# Patient Record
Sex: Male | Born: 1975 | Race: Black or African American | Hispanic: No | Marital: Married | Smoking: Never smoker
Health system: Southern US, Community
[De-identification: ages and names within clinical notes are randomized; demographics above are authoritative.]

## PROBLEM LIST (undated history)

## (undated) DIAGNOSIS — Z8489 Family history of other specified conditions: Secondary | ICD-10-CM

## (undated) DIAGNOSIS — K219 Gastro-esophageal reflux disease without esophagitis: Secondary | ICD-10-CM

## (undated) DIAGNOSIS — N189 Chronic kidney disease, unspecified: Secondary | ICD-10-CM

## (undated) DIAGNOSIS — E11319 Type 2 diabetes mellitus with unspecified diabetic retinopathy without macular edema: Secondary | ICD-10-CM

## (undated) DIAGNOSIS — E119 Type 2 diabetes mellitus without complications: Secondary | ICD-10-CM

## (undated) DIAGNOSIS — I10 Essential (primary) hypertension: Secondary | ICD-10-CM

## (undated) DIAGNOSIS — R011 Cardiac murmur, unspecified: Secondary | ICD-10-CM

## (undated) HISTORY — PX: EYE SURGERY: SHX253

## (undated) HISTORY — DX: Chronic kidney disease, unspecified: N18.9

---

## 2006-01-22 ENCOUNTER — Other Ambulatory Visit: Payer: Self-pay

## 2006-01-22 ENCOUNTER — Emergency Department: Payer: Self-pay | Admitting: Emergency Medicine

## 2006-03-26 ENCOUNTER — Emergency Department: Payer: Self-pay | Admitting: Emergency Medicine

## 2006-03-26 ENCOUNTER — Other Ambulatory Visit: Payer: Self-pay

## 2006-04-15 ENCOUNTER — Other Ambulatory Visit: Payer: Self-pay

## 2006-04-15 ENCOUNTER — Emergency Department: Payer: Self-pay | Admitting: Emergency Medicine

## 2006-08-16 ENCOUNTER — Emergency Department: Payer: Self-pay | Admitting: Emergency Medicine

## 2006-11-08 ENCOUNTER — Emergency Department: Payer: Self-pay | Admitting: Emergency Medicine

## 2007-03-09 ENCOUNTER — Emergency Department: Payer: Self-pay | Admitting: Emergency Medicine

## 2008-08-09 ENCOUNTER — Emergency Department: Payer: Self-pay | Admitting: Emergency Medicine

## 2012-01-04 ENCOUNTER — Emergency Department: Payer: Self-pay | Admitting: Emergency Medicine

## 2014-07-09 ENCOUNTER — Encounter (INDEPENDENT_AMBULATORY_CARE_PROVIDER_SITE_OTHER): Payer: BLUE CROSS/BLUE SHIELD | Admitting: Ophthalmology

## 2014-07-09 DIAGNOSIS — E10351 Type 1 diabetes mellitus with proliferative diabetic retinopathy with macular edema: Secondary | ICD-10-CM

## 2014-07-09 DIAGNOSIS — I10 Essential (primary) hypertension: Secondary | ICD-10-CM

## 2014-07-09 DIAGNOSIS — H43813 Vitreous degeneration, bilateral: Secondary | ICD-10-CM

## 2014-07-09 DIAGNOSIS — H35033 Hypertensive retinopathy, bilateral: Secondary | ICD-10-CM | POA: Diagnosis not present

## 2014-07-09 DIAGNOSIS — H4313 Vitreous hemorrhage, bilateral: Secondary | ICD-10-CM | POA: Diagnosis not present

## 2014-07-09 DIAGNOSIS — E10311 Type 1 diabetes mellitus with unspecified diabetic retinopathy with macular edema: Secondary | ICD-10-CM

## 2014-07-16 ENCOUNTER — Other Ambulatory Visit (INDEPENDENT_AMBULATORY_CARE_PROVIDER_SITE_OTHER): Payer: BLUE CROSS/BLUE SHIELD | Admitting: Ophthalmology

## 2014-07-22 ENCOUNTER — Other Ambulatory Visit (INDEPENDENT_AMBULATORY_CARE_PROVIDER_SITE_OTHER): Payer: BLUE CROSS/BLUE SHIELD | Admitting: Ophthalmology

## 2014-07-22 DIAGNOSIS — E11311 Type 2 diabetes mellitus with unspecified diabetic retinopathy with macular edema: Secondary | ICD-10-CM | POA: Diagnosis not present

## 2014-07-22 DIAGNOSIS — E11351 Type 2 diabetes mellitus with proliferative diabetic retinopathy with macular edema: Secondary | ICD-10-CM

## 2014-09-22 ENCOUNTER — Encounter (INDEPENDENT_AMBULATORY_CARE_PROVIDER_SITE_OTHER): Payer: BLUE CROSS/BLUE SHIELD | Admitting: Ophthalmology

## 2014-10-04 ENCOUNTER — Encounter (INDEPENDENT_AMBULATORY_CARE_PROVIDER_SITE_OTHER): Payer: BLUE CROSS/BLUE SHIELD | Admitting: Ophthalmology

## 2014-10-04 DIAGNOSIS — H4313 Vitreous hemorrhage, bilateral: Secondary | ICD-10-CM | POA: Diagnosis not present

## 2014-10-04 DIAGNOSIS — H43813 Vitreous degeneration, bilateral: Secondary | ICD-10-CM | POA: Diagnosis not present

## 2014-10-04 DIAGNOSIS — I10 Essential (primary) hypertension: Secondary | ICD-10-CM

## 2014-10-04 DIAGNOSIS — E10311 Type 1 diabetes mellitus with unspecified diabetic retinopathy with macular edema: Secondary | ICD-10-CM

## 2014-10-04 DIAGNOSIS — H35033 Hypertensive retinopathy, bilateral: Secondary | ICD-10-CM | POA: Diagnosis not present

## 2014-10-04 DIAGNOSIS — E10351 Type 1 diabetes mellitus with proliferative diabetic retinopathy with macular edema: Secondary | ICD-10-CM

## 2014-10-04 NOTE — H&P (Signed)
Benjamin Ramos is an 39 y.o. male.   Chief Complaint:floaters and loss of vision left eye HPI: Severe diabetes with poor control, now loss of vision left eye  No past medical history on file.  No past surgical history on file.  No family history on file. Social History:  has no tobacco, alcohol, and drug history on file.  Allergies: Allergies not on file  No prescriptions prior to admission    Review of systems otherwise negative  There were no vitals taken for this visit.  Physical exam: Mental status: oriented x3. Eyes: See eye exam associated with this date of surgery in media tab.  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 Plan: To [facility] for Pars plana vitrectomy, membrane peel, laser, gas injection left eye  Benjamin Ramos 10/04/2014, 5:17 PM

## 2014-10-11 ENCOUNTER — Encounter (HOSPITAL_COMMUNITY): Payer: Self-pay | Admitting: *Deleted

## 2014-10-11 MED ORDER — CEFAZOLIN SODIUM-DEXTROSE 2-3 GM-% IV SOLR
2.0000 g | INTRAVENOUS | Status: DC
  Filled 2014-10-11: qty 50

## 2014-10-11 NOTE — Progress Notes (Signed)
Pt gave consent for spouse to complete SDW -pre-op call while in his presence. Pt denies SOB, chest pain, and being under the care of a cardiologist. Pt denies having an echo and cath but stated that a stress test was completed 5-6 years ago. Pt denies having an EKG and chest x ray within the last year. Pt spouse stated that pt was instructed to report at 8:15 AM on DOS. Spouse also stated that pt was instructed to stop taking Aspirin, NSAID's , vitamins and herbal medications days prior to procedure. Both pt and spouse verbalized understanding of pre-op instructions.

## 2014-10-12 ENCOUNTER — Encounter (HOSPITAL_COMMUNITY): Payer: Self-pay | Admitting: Anesthesiology

## 2014-10-12 ENCOUNTER — Encounter (HOSPITAL_COMMUNITY)
Admission: RE | Disposition: A | Discharge status: Home / Self Care | Payer: Self-pay | Source: Ambulatory Visit | Attending: Ophthalmology

## 2014-10-12 ENCOUNTER — Ambulatory Visit (HOSPITAL_COMMUNITY)
Admission: RE | Admit: 2014-10-12 | Discharge: 2014-10-12 | Disposition: A | Discharge status: Home / Self Care | Payer: BLUE CROSS/BLUE SHIELD | Source: Ambulatory Visit | Attending: Ophthalmology | Admitting: Ophthalmology

## 2014-10-12 ENCOUNTER — Encounter (HOSPITAL_COMMUNITY): Payer: Self-pay | Admitting: *Deleted

## 2014-10-12 ENCOUNTER — Encounter (HOSPITAL_COMMUNITY): Payer: BLUE CROSS/BLUE SHIELD | Admitting: Anesthesiology

## 2014-10-12 DIAGNOSIS — Z79899 Other long term (current) drug therapy: Secondary | ICD-10-CM | POA: Diagnosis not present

## 2014-10-12 DIAGNOSIS — E1165 Type 2 diabetes mellitus with hyperglycemia: Secondary | ICD-10-CM | POA: Insufficient documentation

## 2014-10-12 DIAGNOSIS — H43392 Other vitreous opacities, left eye: Secondary | ICD-10-CM | POA: Insufficient documentation

## 2014-10-12 DIAGNOSIS — H5462 Unqualified visual loss, left eye, normal vision right eye: Secondary | ICD-10-CM | POA: Diagnosis not present

## 2014-10-12 DIAGNOSIS — Z539 Procedure and treatment not carried out, unspecified reason: Secondary | ICD-10-CM | POA: Diagnosis not present

## 2014-10-12 DIAGNOSIS — H3322 Serous retinal detachment, left eye: Secondary | ICD-10-CM | POA: Insufficient documentation

## 2014-10-12 DIAGNOSIS — E11359 Type 2 diabetes mellitus with proliferative diabetic retinopathy without macular edema: Secondary | ICD-10-CM | POA: Diagnosis present

## 2014-10-12 DIAGNOSIS — H4312 Vitreous hemorrhage, left eye: Secondary | ICD-10-CM | POA: Insufficient documentation

## 2014-10-12 HISTORY — DX: Essential (primary) hypertension: I10

## 2014-10-12 HISTORY — DX: Type 2 diabetes mellitus with unspecified diabetic retinopathy without macular edema: E11.319

## 2014-10-12 HISTORY — DX: Family history of other specified conditions: Z84.89

## 2014-10-12 HISTORY — DX: Gastro-esophageal reflux disease without esophagitis: K21.9

## 2014-10-12 HISTORY — DX: Type 2 diabetes mellitus without complications: E11.9

## 2014-10-12 HISTORY — DX: Cardiac murmur, unspecified: R01.1

## 2014-10-12 LAB — CBC
HCT: 40.9 % (ref 39.0–52.0)
Hemoglobin: 13.4 g/dL (ref 13.0–17.0)
MCH: 28.6 pg (ref 26.0–34.0)
MCHC: 32.8 g/dL (ref 30.0–36.0)
MCV: 87.4 fL (ref 78.0–100.0)
Platelets: 246 10*3/uL (ref 150–400)
RBC: 4.68 MIL/uL (ref 4.22–5.81)
RDW: 13.5 % (ref 11.5–15.5)
WBC: 6.4 10*3/uL (ref 4.0–10.5)

## 2014-10-12 LAB — BASIC METABOLIC PANEL
ANION GAP: 9 (ref 5–15)
BUN: 18 mg/dL (ref 6–20)
CO2: 23 mmol/L (ref 22–32)
Calcium: 9.4 mg/dL (ref 8.9–10.3)
Chloride: 105 mmol/L (ref 101–111)
Creatinine, Ser: 1.3 mg/dL — ABNORMAL HIGH (ref 0.61–1.24)
Glucose, Bld: 324 mg/dL — ABNORMAL HIGH (ref 65–99)
Potassium: 4.6 mmol/L (ref 3.5–5.1)
Sodium: 137 mmol/L (ref 135–145)

## 2014-10-12 LAB — GLUCOSE, CAPILLARY: Glucose-Capillary: 313 mg/dL — ABNORMAL HIGH (ref 65–99)

## 2014-10-12 SURGERY — PARS PLANA VITRECTOMY WITH 25 GAUGE
Anesthesia: General | Site: Eye | Laterality: Left

## 2014-10-12 MED ORDER — CYCLOPENTOLATE HCL 1 % OP SOLN: 1.0000 [drp] | OPHTHALMIC | Status: DC | PRN

## 2014-10-12 MED ORDER — GATIFLOXACIN 0.5 % OP SOLN
1.0000 [drp] | OPHTHALMIC | Status: DC | PRN
  Filled 2014-10-12: qty 2.5

## 2014-10-12 MED ORDER — MIDAZOLAM HCL 2 MG/2ML IJ SOLN
INTRAMUSCULAR | Status: AC
  Filled 2014-10-12: qty 2

## 2014-10-12 MED ORDER — TROPICAMIDE 1 % OP SOLN: 1.0000 [drp] | OPHTHALMIC | Status: DC | PRN

## 2014-10-12 MED ORDER — GATIFLOXACIN 0.5 % OP SOLN: 1.0000 [drp] | OPHTHALMIC | Status: DC | PRN

## 2014-10-12 MED ORDER — PHENYLEPHRINE HCL 2.5 % OP SOLN: 1.0000 [drp] | OPHTHALMIC | Status: DC | PRN

## 2014-10-12 MED ORDER — CYCLOPENTOLATE HCL 1 % OP SOLN
1.0000 [drp] | OPHTHALMIC | Status: DC | PRN
  Filled 2014-10-12: qty 2

## 2014-10-12 MED ORDER — TROPICAMIDE 1 % OP SOLN
1.0000 [drp] | OPHTHALMIC | Status: DC | PRN
  Filled 2014-10-12: qty 3

## 2014-10-12 MED ORDER — PHENYLEPHRINE HCL 2.5 % OP SOLN
1.0000 [drp] | OPHTHALMIC | Status: DC | PRN
  Filled 2014-10-12: qty 2

## 2014-10-12 NOTE — H&P (Signed)
I examined the patient today and there is no change in the medical status 

## 2014-10-18 ENCOUNTER — Encounter (INDEPENDENT_AMBULATORY_CARE_PROVIDER_SITE_OTHER): Payer: BLUE CROSS/BLUE SHIELD | Admitting: Ophthalmology

## 2014-10-28 ENCOUNTER — Encounter (INDEPENDENT_AMBULATORY_CARE_PROVIDER_SITE_OTHER): Payer: BLUE CROSS/BLUE SHIELD | Admitting: Ophthalmology

## 2014-10-28 DIAGNOSIS — H4313 Vitreous hemorrhage, bilateral: Secondary | ICD-10-CM

## 2014-10-28 DIAGNOSIS — H35033 Hypertensive retinopathy, bilateral: Secondary | ICD-10-CM | POA: Diagnosis not present

## 2014-10-28 DIAGNOSIS — E11351 Type 2 diabetes mellitus with proliferative diabetic retinopathy with macular edema: Secondary | ICD-10-CM

## 2014-10-28 DIAGNOSIS — E11311 Type 2 diabetes mellitus with unspecified diabetic retinopathy with macular edema: Secondary | ICD-10-CM

## 2014-10-28 DIAGNOSIS — I10 Essential (primary) hypertension: Secondary | ICD-10-CM | POA: Diagnosis not present

## 2014-12-23 ENCOUNTER — Encounter (INDEPENDENT_AMBULATORY_CARE_PROVIDER_SITE_OTHER): Payer: BLUE CROSS/BLUE SHIELD | Admitting: Ophthalmology

## 2015-08-12 ENCOUNTER — Ambulatory Visit (INDEPENDENT_AMBULATORY_CARE_PROVIDER_SITE_OTHER): Payer: BLUE CROSS/BLUE SHIELD | Admitting: Sports Medicine

## 2015-08-12 ENCOUNTER — Encounter: Payer: Self-pay | Admitting: Sports Medicine

## 2015-08-12 DIAGNOSIS — M79672 Pain in left foot: Secondary | ICD-10-CM

## 2015-08-12 DIAGNOSIS — L02612 Cutaneous abscess of left foot: Secondary | ICD-10-CM

## 2015-08-12 DIAGNOSIS — E11621 Type 2 diabetes mellitus with foot ulcer: Secondary | ICD-10-CM | POA: Diagnosis not present

## 2015-08-12 DIAGNOSIS — L97529 Non-pressure chronic ulcer of other part of left foot with unspecified severity: Principal | ICD-10-CM

## 2015-08-12 DIAGNOSIS — L89891 Pressure ulcer of other site, stage 1: Secondary | ICD-10-CM | POA: Diagnosis not present

## 2015-08-12 DIAGNOSIS — L03032 Cellulitis of left toe: Secondary | ICD-10-CM

## 2015-08-12 MED ORDER — AMOXICILLIN-POT CLAVULANATE 875-125 MG PO TABS: 1.0000 | ORAL_TABLET | Freq: Two times a day (BID) | ORAL | Status: DC

## 2015-08-12 NOTE — Progress Notes (Signed)
Patient ID: Benjamin Ramos, male   DOB: 03-04-1975, 40 y.o.   MRN: [id number] Subjective: Benjamin Ramos is a 40 y.o. male patient seen in office for evaluation of ulceration of the Left great toe; states that it started out as a big piece of hard skin that got a crack in it several months ago. Patient has a history of diabetes and a blood glucose level not recorded today but A1c is down from 11.5 to 8.5. Patient is changing the dressing using a natural solution called Methalou. Denies nausea/fever/vomiting/chills/shortness of breath/pain. Admits to night sweats. Patient has no other pedal complaints at this time.  Patient is assisted by wife at this visit.   There are no active problems to display for this patient.  Current Outpatient Prescriptions on File Prior to Visit  Medication Sig Dispense Refill  . aspirin 325 MG tablet Take 325 mg by mouth daily as needed for mild pain.    . metFORMIN (GLUCOPHAGE-XR) 500 MG 24 hr tablet Take 1,000 mg by mouth daily.     . prednisoLONE acetate (PRED FORTE) 1 % ophthalmic suspension Place 1 drop into both eyes every 6 (six) hours.      No current facility-administered medications on file prior to visit.   No Known Allergies  No results found for this or any previous visit (from the past 2160 hour(s)).  Objective: There were no vitals filed for this visit.  General: Patient is awake, alert, oriented x 3 and in no acute distress.  Dermatology: Skin is warm and dry bilateral with a partial thickness ulceration present  Left medial hallux. Ulceration measures 2.5 cm x 2.5 cm x 0.3 cm. There is a  Severely keratotic border with a granular base. The ulceration does not  probe to bone. There is mild malodor, mild active drainage, no erythema, no edema. No other acute signs of infection.   Vascular: Dorsalis Pedis pulse = 2/4 Bilateral,  Posterior Tibial pulse = 1/4 Bilateral,  Capillary Fill Time < 5 seconds, 1+ pitting edema bilateral.   Neurologic:  Protective sensation intact however vibratory sensation diminished bilateral.   Musculosketal: No Pain with palpation to ulcerated area. No pain with compression to calves bilateral. Hallux limitus bilateral.   Assessment and Plan:  Problem List Items Addressed This Visit    None    Visit Diagnoses    Diabetic ulcer of left foot associated with type 2 diabetes mellitus (HCC)    -  Primary    Relevant Medications    glimepiride (AMARYL) 2 MG tablet    losartan-hydrochlorothiazide (HYZAAR) 100-25 MG tablet    amoxicillin-clavulanate (AUGMENTIN) 875-125 MG tablet    Other Relevant Orders    WOUND CULTURE    Cellulitis and abscess of toe, left        Relevant Medications    amoxicillin-clavulanate (AUGMENTIN) 875-125 MG tablet    Other Relevant Orders    WOUND CULTURE    Foot pain, left        Relevant Medications    amoxicillin-clavulanate (AUGMENTIN) 875-125 MG tablet    Other Relevant Orders    WOUND CULTURE      -Examined patient and discussed the progression of the wound and treatment alternatives. - Excisionally dedbrided ulceration to healthy bleeding borders using a sterile tissue nipper. -Applied iodosorb, offloading pad, and dry sterile dressing and instructed patient to continue with daily dressings at home consisting of betadine and bandaid/dry sterile dressing. Refrain from natural remedies unless approved by me -Wound culture  obtained and sent to [facility] -Rx Augmentin -Dispensed post op shoe and instructed patient to wear at all times when ambulating until ulceration is healed; Work note given to allow shoe or to modify duty to allow patient to be able to wear this shoe at work - Advised patient to go to the ER or return to office if the wound worsens or if constitutional symptoms are present. -Patient to return to office in 2 weeks for follow up care and evaluation or sooner if problems arise. Will xray at next encounter if fails to improve.  Asencion Islam, DPM

## 2015-08-15 ENCOUNTER — Telehealth: Payer: Self-pay | Admitting: Sports Medicine

## 2015-08-15 NOTE — Telephone Encounter (Signed)
Pt wants to know if it is ok to get his foot week since dr. Marylene Landstover removed a callus

## 2015-08-15 NOTE — Telephone Encounter (Signed)
Informed pt if there was no bleeding to the area where the callous was trimmed he could get it wet.

## 2015-08-16 ENCOUNTER — Telehealth: Payer: Self-pay | Admitting: Sports Medicine

## 2015-08-16 DIAGNOSIS — L089 Local infection of the skin and subcutaneous tissue, unspecified: Secondary | ICD-10-CM

## 2015-08-16 MED ORDER — CIPROFLOXACIN HCL 500 MG PO TABS: 500.0000 mg | ORAL_TABLET | Freq: Two times a day (BID) | ORAL | Status: DC

## 2015-08-16 NOTE — Telephone Encounter (Signed)
Called patient to review wound culture results. Positive for MSSA and Beta hemolytic strep. Sent to patient pharmacy Cipro to take in addition to Augmentin until completed. Patient to follow up as scheduled or sooner if issues arise -Dr. Marylene LandStover

## 2015-08-22 ENCOUNTER — Encounter: Payer: Self-pay | Admitting: Sports Medicine

## 2015-08-26 ENCOUNTER — Ambulatory Visit (INDEPENDENT_AMBULATORY_CARE_PROVIDER_SITE_OTHER): Payer: BLUE CROSS/BLUE SHIELD | Admitting: Sports Medicine

## 2015-08-26 ENCOUNTER — Encounter: Payer: Self-pay | Admitting: Sports Medicine

## 2015-08-26 DIAGNOSIS — E119 Type 2 diabetes mellitus without complications: Secondary | ICD-10-CM | POA: Diagnosis not present

## 2015-08-26 DIAGNOSIS — L84 Corns and callosities: Secondary | ICD-10-CM | POA: Diagnosis not present

## 2015-08-26 NOTE — Progress Notes (Signed)
Patient ID: Benjamin Ramos, male   DOB: 04-14-1975, 40 y.o.   MRN: [id number]   Subjective: Benjamin Ramos is a 40 y.o. male patient seen in office for follow up evaluation of ulceration of the Left great toe; states that it is much better and has been dressing area with betadine with assistance of wife. Patient is also taking antibiotics with no problems. Denies nausea/fever/vomiting/chills/night sweats/shortness of breath/pain. Patient has no other pedal complaints at this time.  Patient is assisted by wife and daughter at this visit.   There are no active problems to display for this patient.  Current Outpatient Prescriptions on File Prior to Visit  Medication Sig Dispense Refill  . amoxicillin-clavulanate (AUGMENTIN) 875-125 MG tablet Take 1 tablet by mouth 2 (two) times daily. 20 tablet 0  . aspirin 325 MG tablet Take 325 mg by mouth daily as needed for mild pain.    . ciprofloxacin (CIPRO) 500 MG tablet Take 1 tablet (500 mg total) by mouth 2 (two) times daily. 20 tablet 0  . glimepiride (AMARYL) 2 MG tablet     . losartan-hydrochlorothiazide (HYZAAR) 100-25 MG tablet     . metFORMIN (GLUCOPHAGE-XR) 500 MG 24 hr tablet Take 1,000 mg by mouth daily.     . prednisoLONE acetate (PRED FORTE) 1 % ophthalmic suspension Place 1 drop into both eyes every 6 (six) hours.      No current facility-administered medications on file prior to visit.   No Known Allergies  No results found for this or any previous visit (from the past 2160 hour(s)).  Objective: There were no vitals filed for this visit.  General: Patient is awake, alert, oriented x 3 and in no acute distress.  Dermatology: Skin is warm and dry bilateral with a now healed previous ulceration at Left medial hallux, no erythema, no edema. No other acute signs of infection.   Vascular: Dorsalis Pedis pulse = 2/4 Bilateral,  Posterior Tibial pulse = 1/4 Bilateral,  Capillary Fill Time < 5 seconds, 1+ pitting edema bilateral.    Neurologic: Protective sensation intact however vibratory sensation diminished bilateral.   Musculosketal: No Pain with palpation to pre-ulcerative area. No pain with compression to calves bilateral. Hallux limitus bilateral.   Assessment and Plan:  Problem List Items Addressed This Visit    None    Visit Diagnoses    Pre-ulcerative calluses    -  Primary    Left medial hallux at area of previous ulceration     Diabetes mellitus without complication (HCC)          -Examined patient and discussed continue plan of care -Dispensed silicone toe pad to left and advised patient that he may slowly return to normal shoes; in future we will make orthotics to prevent recurrence since patient has hallux limitus which is likely contributing to his condition -Cont with Augmentin and Cipro until completed for + wound culture of S. Aureus and Beta Hemplytic Strept - Advised patient to go to the ER or return to office if the wound recurs or if area worsens or if constitutional symptoms are present. -Patient to return to office in 4 weeks for follow up care and evaluation or sooner if problems arise. Will also set up patient for routine diabetic nail care if area remains healed at next visit.   Asencion Islamitorya Yunior Jain, DPM

## 2015-09-23 ENCOUNTER — Ambulatory Visit: Payer: BLUE CROSS/BLUE SHIELD | Admitting: Sports Medicine

## 2015-10-07 ENCOUNTER — Ambulatory Visit (INDEPENDENT_AMBULATORY_CARE_PROVIDER_SITE_OTHER): Payer: BLUE CROSS/BLUE SHIELD | Admitting: Sports Medicine

## 2015-10-07 ENCOUNTER — Encounter: Payer: Self-pay | Admitting: Sports Medicine

## 2015-10-07 DIAGNOSIS — L89891 Pressure ulcer of other site, stage 1: Secondary | ICD-10-CM

## 2015-10-07 DIAGNOSIS — L97529 Non-pressure chronic ulcer of other part of left foot with unspecified severity: Principal | ICD-10-CM

## 2015-10-07 DIAGNOSIS — L02612 Cutaneous abscess of left foot: Secondary | ICD-10-CM

## 2015-10-07 DIAGNOSIS — E11621 Type 2 diabetes mellitus with foot ulcer: Secondary | ICD-10-CM

## 2015-10-07 DIAGNOSIS — L03032 Cellulitis of left toe: Secondary | ICD-10-CM

## 2015-10-07 DIAGNOSIS — M79672 Pain in left foot: Secondary | ICD-10-CM

## 2015-10-07 DIAGNOSIS — L089 Local infection of the skin and subcutaneous tissue, unspecified: Secondary | ICD-10-CM

## 2015-10-07 MED ORDER — CIPROFLOXACIN HCL 500 MG PO TABS: 500.0000 mg | ORAL_TABLET | Freq: Two times a day (BID) | ORAL | 0 refills | Status: DC

## 2015-10-07 MED ORDER — AMOXICILLIN-POT CLAVULANATE 875-125 MG PO TABS: 1.0000 | ORAL_TABLET | Freq: Two times a day (BID) | ORAL | 0 refills | Status: DC

## 2015-10-07 NOTE — Progress Notes (Signed)
Patient ID: Benjamin Ramos Phegley, male   DOB: 02-10-1976, 40 y.o.   MRN: [id number]   Subjective: Benjamin Ramos Brickle is a 40 y.o. Diabetic male patient seen in office for follow up evaluation of ulceration of the Left great toe; states that missed last appointment and last week started to drain; thinks from wearing ill fitting shoes. Applied betadine. Denies nausea/fever/vomiting/chills/night sweats/shortness of breath/pain. Patient has no other pedal complaints at this time.  FBS 127  There are no active problems to display for this patient.  Current Outpatient Prescriptions on File Prior to Visit  Medication Sig Dispense Refill  . aspirin 325 MG tablet Take 325 mg by mouth daily as needed for mild pain.    Marland Kitchen. glimepiride (AMARYL) 2 MG tablet     . losartan-hydrochlorothiazide (HYZAAR) 100-25 MG tablet     . metFORMIN (GLUCOPHAGE-XR) 500 MG 24 hr tablet Take 1,000 mg by mouth daily.     . prednisoLONE acetate (PRED FORTE) 1 % ophthalmic suspension Place 1 drop into both eyes every 6 (six) hours.      No current facility-administered medications on file prior to visit.    No Known Allergies  No results found for this or any previous visit (from the past 2160 hour(s)).  Objective: There were no vitals filed for this visit.  General: Patient is awake, alert, oriented x 3 and in no acute distress.  Dermatology: Skin is warm and dry bilateral with a partial thickness ulceration at Left medial hallux measuring 3x3cm, no erythema, no edema, mild odor. No other acute signs of infection.   Vascular: Dorsalis Pedis pulse = 2/4 Bilateral,  Posterior Tibial pulse = 1/4 Bilateral,  Capillary Fill Time < 5 seconds, trace edema bilateral.   Neurologic: Protective sensation intact however vibratory sensation diminished bilateral.   Musculosketal: No Pain with palpation to ulcerative area. No pain with compression to calves bilateral. Hallux limitus bilateral.   Assessment and Plan:  Problem List Items  Addressed This Visit    None    Visit Diagnoses    Diabetic ulcer of left foot associated with type 2 diabetes mellitus (HCC)       Relevant Medications   amoxicillin-clavulanate (AUGMENTIN) 875-125 MG tablet   Cellulitis and abscess of toe, left       Relevant Medications   amoxicillin-clavulanate (AUGMENTIN) 875-125 MG tablet   Foot pain, left       Relevant Medications   amoxicillin-clavulanate (AUGMENTIN) 875-125 MG tablet   Foot infection       Relevant Medications   ciprofloxacin (CIPRO) 500 MG tablet     -Examined patient and discussed continue plan of care for recurrent ulceration -Excisionally debrided ulceration to healthy bleeding and applied iodosorb with offloading pad and dry dressing. Cont with daily dressings at home consisting of betadine -Rx Augmentin and Cipro for past + culture of S. Aureus and Beta Hemolytic strep -Return to using post op shoe In future we will make orthotics to prevent recurrence since patient has hallux limitus which is likely contributing to his condition - Advised patient to go to the ER or return to office if the wound or if area worsens or if constitutional symptoms are present. -Patient to return to office in 2 weeks for follow up care and evaluation or sooner if problems arise.  Asencion Islamitorya Naijah Lacek, DPM

## 2015-10-21 ENCOUNTER — Ambulatory Visit: Payer: BLUE CROSS/BLUE SHIELD | Admitting: Sports Medicine

## 2015-10-25 ENCOUNTER — Encounter: Payer: Self-pay | Admitting: Sports Medicine

## 2015-10-25 ENCOUNTER — Ambulatory Visit (INDEPENDENT_AMBULATORY_CARE_PROVIDER_SITE_OTHER): Payer: BLUE CROSS/BLUE SHIELD | Admitting: Sports Medicine

## 2015-10-25 DIAGNOSIS — M79672 Pain in left foot: Secondary | ICD-10-CM | POA: Diagnosis not present

## 2015-10-25 DIAGNOSIS — E11621 Type 2 diabetes mellitus with foot ulcer: Secondary | ICD-10-CM | POA: Diagnosis not present

## 2015-10-25 DIAGNOSIS — L97529 Non-pressure chronic ulcer of other part of left foot with unspecified severity: Secondary | ICD-10-CM

## 2015-10-25 NOTE — Progress Notes (Signed)
Patient ID: Lynett FishRoy F Spranger, male   DOB: 08-24-1975, 40 y.o.   MRN: [id number]   Subjective: Lynett FishRoy F Innes is a 40 y.o. Diabetic male patient seen in office for follow up evaluation of ulceration of the Left great toe; states that it feels better and has been dry with no drainage, has been applying betadine. Denies nausea/fever/vomiting/chills/night sweats/shortness of breath/pain. Patient has no other pedal complaints at this time.  Tolerating antibiotics well with no issues.  FBS not recorded meter broken  There are no active problems to display for this patient.  Current Outpatient Prescriptions on File Prior to Visit  Medication Sig Dispense Refill  . amoxicillin-clavulanate (AUGMENTIN) 875-125 MG tablet Take 1 tablet by mouth 2 (two) times daily. 20 tablet 0  . aspirin 325 MG tablet Take 325 mg by mouth daily as needed for mild pain.    . ciprofloxacin (CIPRO) 500 MG tablet Take 1 tablet (500 mg total) by mouth 2 (two) times daily. 20 tablet 0  . glimepiride (AMARYL) 2 MG tablet     . losartan-hydrochlorothiazide (HYZAAR) 100-25 MG tablet     . metFORMIN (GLUCOPHAGE-XR) 500 MG 24 hr tablet Take 1,000 mg by mouth daily.     . prednisoLONE acetate (PRED FORTE) 1 % ophthalmic suspension Place 1 drop into both eyes every 6 (six) hours.      No current facility-administered medications on file prior to visit.    No Known Allergies  No results found for this or any previous visit (from the past 2160 hour(s)).  Objective: There were no vitals filed for this visit.  General: Patient is awake, alert, oriented x 3 and in no acute distress.  Dermatology: Skin is warm and dry bilateral with a now healed ulceration at Left medial hallux, no erythema, no edema, no malodor. No other acute signs of infection.   Vascular: Dorsalis Pedis pulse = 2/4 Bilateral,  Posterior Tibial pulse = 1/4 Bilateral,  Capillary Fill Time < 5 seconds, trace edema bilateral.   Neurologic: Protective sensation intact  however vibratory sensation diminished bilateral.   Musculosketal: No Pain with palpation to pre-ulcerative area. No pain with compression to calves bilateral. Hallux limitus bilateral.   Assessment and Plan:  Problem List Items Addressed This Visit    None    Visit Diagnoses    Diabetic ulcer of left foot associated with type 2 diabetes mellitus (HCC)    -  Primary   Prematurely healed   Foot pain, left         -Examined patient and discussed continue plan of care for healed ulceration -Debrided left hallux pre-ulcerative lesion with no underlying opening. Cont with offloading pads and post op shoe x 1 week -Continue Augmentin and Cipro until completed for past + culture of S. Aureus and Beta Hemolytic strep -[employer] diabetic shoe order form was completed; office to contact primary care for approval / certification;  Office to arrange shoe fitting and dispensing. -Patient to return to office for shoe measurement  or sooner if problems arise.  Asencion Islamitorya Jackelyn Illingworth, DPM

## 2015-11-08 ENCOUNTER — Telehealth: Payer: Self-pay | Admitting: *Deleted

## 2015-11-08 NOTE — Telephone Encounter (Signed)
11/07/15 10:25 am  Received transferred voicemail from Benjamin Ramos stating she has left multiple messages in regards to her husbands diabetic shoes and has not gotten a return call.  [place] Patient.  11/08/15 10:08 am  Called and spoke with patient he states that they called his insurance [address] [employer] and they told his wife that they would pay for the shoes and inserts if there is a written prescription on file.  I explained to the patient the diabetic shoes are a medicare program, I do know his insurance will cover a portion of the orthotics but was my understanding that the shoes were not covered.  I explained the Charges $190 for the shoes and $354 for the inserts and that he would be responsible for the balance of any denied charges.  He stated understanding.  I verified the doctor on file and he stated that he no longer see that doctor.  New [facility] [place].  I will submit our paperwork for authorization and contact him if/when we receive it. Patient stated understanding

## 2015-11-15 ENCOUNTER — Encounter: Payer: Self-pay | Admitting: Podiatry

## 2015-11-15 ENCOUNTER — Ambulatory Visit (INDEPENDENT_AMBULATORY_CARE_PROVIDER_SITE_OTHER): Payer: BLUE CROSS/BLUE SHIELD | Admitting: Podiatry

## 2015-11-15 VITALS — BP 186/112 | HR 81 | Resp 16

## 2015-11-15 DIAGNOSIS — M79676 Pain in unspecified toe(s): Secondary | ICD-10-CM | POA: Diagnosis not present

## 2015-11-15 DIAGNOSIS — E11621 Type 2 diabetes mellitus with foot ulcer: Secondary | ICD-10-CM | POA: Diagnosis not present

## 2015-11-15 DIAGNOSIS — L84 Corns and callosities: Secondary | ICD-10-CM | POA: Diagnosis not present

## 2015-11-15 DIAGNOSIS — L97529 Non-pressure chronic ulcer of other part of left foot with unspecified severity: Secondary | ICD-10-CM

## 2015-11-15 DIAGNOSIS — B351 Tinea unguium: Secondary | ICD-10-CM | POA: Diagnosis not present

## 2015-11-15 DIAGNOSIS — M79672 Pain in left foot: Secondary | ICD-10-CM

## 2015-11-15 NOTE — Patient Instructions (Signed)
Diabetes and Foot Care Diabetes may cause you to have problems because of poor blood supply (circulation) to your feet and legs. This may cause the skin on your feet to become thinner, break easier, and heal more slowly. Your skin may become dry, and the skin may peel and crack. You may also have nerve damage in your legs and feet causing decreased feeling in them. You may not notice minor injuries to your feet that could lead to infections or more serious problems. Taking care of your feet is one of the most important things you can do for yourself.  HOME CARE INSTRUCTIONS  Wear shoes at all times, even in the house. Do not go barefoot. Bare feet are easily injured.  Check your feet daily for blisters, cuts, and redness. If you cannot see the bottom of your feet, use a mirror or ask someone for help.  Wash your feet with warm water (do not use hot water) and mild soap. Then pat your feet and the areas between your toes until they are completely dry. Do not soak your feet as this can dry your skin.  Apply a moisturizing lotion or petroleum jelly (that does not contain alcohol and is unscented) to the skin on your feet and to dry, brittle toenails. Do not apply lotion between your toes.  Trim your toenails straight across. Do not dig under them or around the cuticle. File the edges of your nails with an emery board or nail file.  Do not cut corns or calluses or try to remove them with medicine.  Wear clean socks or stockings every day. Make sure they are not too tight. Do not wear knee-high stockings since they may decrease blood flow to your legs.  Wear shoes that fit properly and have enough cushioning. To break in new shoes, wear them for just a few hours a day. This prevents you from injuring your feet. Always look in your shoes before you put them on to be sure there are no objects inside.  Do not cross your legs. This may decrease the blood flow to your feet.  If you find a minor scrape,  cut, or break in the skin on your feet, keep it and the skin around it clean and dry. These areas may be cleansed with mild soap and water. Do not cleanse the area with peroxide, alcohol, or iodine.  When you remove an adhesive bandage, be sure not to damage the skin around it.  If you have a wound, look at it several times a day to make sure it is healing.  Do not use heating pads or hot water bottles. They may burn your skin. If you have lost feeling in your feet or legs, you may not know it is happening until it is too late.  Make sure your health care provider performs a complete foot exam at least annually or more often if you have foot problems. Report any cuts, sores, or bruises to your health care provider immediately. SEEK MEDICAL CARE IF:   You have an injury that is not healing.  You have cuts or breaks in the skin.  You have an ingrown nail.  You notice redness on your legs or feet.  You feel burning or tingling in your legs or feet.  You have pain or cramps in your legs and feet.  Your legs or feet are numb.  Your feet always feel cold. SEEK IMMEDIATE MEDICAL CARE IF:   There is increasing redness,   swelling, or pain in or around a wound.  There is a red line that goes up your leg.  Pus is coming from a wound.  You develop a fever or as directed by your health care provider.  You notice a bad smell coming from an ulcer or wound.   This information is not intended to replace advice given to you by your health care provider. Make sure you discuss any questions you have with your health care provider.   Document Released: 02/10/2000 Document Revised: 10/15/2012 Document Reviewed: 07/22/2012 Elsevier Interactive Patient Education 2016 [employer]  

## 2015-11-15 NOTE — Progress Notes (Signed)
SUBJECTIVE Patient with a history of diabetes mellitus presents to office today complaining of elongated, thickened nails. Pain while ambulating in shoes. Patient is unable to trim their own nails.  Patient also has a callus lesion noted to the left hallux plantar leak. This lesion needs to be an ulcer which seems to have callused over. Patient also has a question about diabetic shoes which he states he ordered at one point pending approval through his primary care physician.  No Known Allergies  OBJECTIVE General Patient is awake, alert, and oriented x 3 and in no acute distress. Derm Skin is dry and supple bilateral. Negative open lesions or macerations. Remaining integument unremarkable. Nails are tender, long, thickened and dystrophic with subungual debris, consistent with onychomycosis, 1-5 bilateral. No signs of infection noted. Vasc  DP and PT pedal pulses palpable bilaterally. Temperature gradient within normal limits.  Neuro Epicritic and protective threshold sensation diminished bilaterally.  Musculoskeletal Exam No symptomatic pedal deformities noted bilateral. Muscular strength within normal limits.  ASSESSMENT 1. Diabetes Mellitus w/ peripheral neuropathy 2. Onychomycosis of nail due to dermatophyte bilateral 3. Pain in foot bilateral 4. Healed ulceration noted to the left hallux. 5. Callus left hallux  PLAN OF CARE Patient evaluated today. Instructed to maintain good pedal hygiene and foot care. Stressed importance of controlling blood sugar.  Mechanical debridement of nails 1-5 bilaterally performed using a nail nipper. Filed with dremel without incident.  Mechanical debridement of the callus lesion to the left hallux was performed using a chisel blade. All patient questions were answered. Return to clinic in 3 mos.   Also working on the approval paperwork to get the patient diabetic shoe gear with Plastizote inserts. Follow up with Melody.   Felecia ShellingBrent M Ramos, DPM

## 2016-05-28 ENCOUNTER — Ambulatory Visit (INDEPENDENT_AMBULATORY_CARE_PROVIDER_SITE_OTHER): Payer: BLUE CROSS/BLUE SHIELD | Admitting: Podiatry

## 2016-05-28 ENCOUNTER — Encounter: Payer: Self-pay | Admitting: Podiatry

## 2016-05-28 VITALS — BP 175/101 | HR 84 | Temp 98.2°F

## 2016-05-28 DIAGNOSIS — E11621 Type 2 diabetes mellitus with foot ulcer: Secondary | ICD-10-CM

## 2016-05-28 DIAGNOSIS — L97521 Non-pressure chronic ulcer of other part of left foot limited to breakdown of skin: Secondary | ICD-10-CM | POA: Diagnosis not present

## 2016-05-28 DIAGNOSIS — L84 Corns and callosities: Secondary | ICD-10-CM

## 2016-05-28 MED ORDER — DOXYCYCLINE HYCLATE 100 MG PO TABS: 100.0000 mg | ORAL_TABLET | Freq: Two times a day (BID) | ORAL | 0 refills | Status: AC

## 2016-05-28 NOTE — Progress Notes (Signed)
This patient presents the office with chief complaint of a blister or an abscess on the bottom of the left big toe. He says that last Friday. He noticed there was a blister and bleeding noted from the toe as the blister has enlarged since last Friday and he presents the office today for an evaluation. Patient is a diabetic who previously had been treated for an ulcer on the left hallux. He presents the office today for continued evaluation of this blister.    GENERAL APPEARANCE: Alert, conversant. Appropriately groomed. No acute distress.  VASCULAR: Pedal pulses are  palpable at  [facility] and PT bilateral.  Capillary refill time is immediate to all digits,  Normal temperature gradient.  Digital hair growth is present bilateral  NEUROLOGIC: sensation is normal to 5.07 monofilament at 5/5 sites bilateral.  Light touch is intact bilateral, Muscle strength normal.  MUSCULOSKELETAL: acceptable muscle strength, tone and stability bilateral.  Intrinsic muscluature intact bilateral.  Rectus appearance of foot and digits noted bilateral.   DERMATOLOGIC: skin color, texture, and turgor are within normal limits.  No preulcerative lesions or ulcers  are seen, no interdigital maceration noted.  No open lesions present.  Digital nails are asymptomatic. No drainage noted. Black callus tissue noted under the medial aspect of the lateral left hallux  above a fluctuant blister on the left hallux.  No evidence of any redness, swelling or evidence of pus or drainage.  Blister left hallyux  Pre-ulcerous lesion left hallux.  ROV  incision and drainage of the blister. 2 longitudinal incisions were made on the medial and lateral aspect of the blister. Care was taken to keep the skin above the blister intact. The drainage was clear fluid with no evidence of any pus coming from the toe. The blister then was bandaged with Neosporin and a dry sterile dressing. Home instructions for soaks were given. He was prescribed a prescription of  doxycycline 100 mg 1 twice a day return to the office in one week for further evaluation and treatment.  If this condition worsens or becomes very painful, the patient was told to contact this office or go to the Emergency Department at the hospital.  Helane Gunther DPM

## 2016-06-01 ENCOUNTER — Ambulatory Visit: Payer: BLUE CROSS/BLUE SHIELD | Admitting: Podiatry

## 2016-06-07 ENCOUNTER — Ambulatory Visit: Payer: BLUE CROSS/BLUE SHIELD | Admitting: Podiatry

## 2016-07-05 ENCOUNTER — Encounter: Payer: BLUE CROSS/BLUE SHIELD | Attending: Internal Medicine | Admitting: *Deleted

## 2016-07-05 ENCOUNTER — Encounter: Payer: Self-pay | Admitting: *Deleted

## 2016-07-05 VITALS — BP 134/94 | Ht 71.0 in | Wt 242.2 lb

## 2016-07-05 DIAGNOSIS — Z713 Dietary counseling and surveillance: Secondary | ICD-10-CM | POA: Diagnosis present

## 2016-07-05 DIAGNOSIS — N184 Chronic kidney disease, stage 4 (severe): Secondary | ICD-10-CM

## 2016-07-05 DIAGNOSIS — E119 Type 2 diabetes mellitus without complications: Secondary | ICD-10-CM | POA: Diagnosis not present

## 2016-07-05 DIAGNOSIS — E1122 Type 2 diabetes mellitus with diabetic chronic kidney disease: Secondary | ICD-10-CM

## 2016-07-05 NOTE — Patient Instructions (Signed)
Check blood sugars 2 x day before breakfast and 2 hrs after supper every day  Bring blood sugar records to the next class  Exercise: Walk   for    10-15  minutes  3 days a week and gradually increase to 30 minutes 5 x week  Eat 3 meals day, 1-2  snacks a day Space meals 4-6 hours apart Don't skip meals Limit fried foods Avoid sugar sweetened drinks (soda)  Return for classes on:

## 2016-07-05 NOTE — Progress Notes (Signed)
Diabetes Self-Management Education  Visit Type: First/Initial  Appt. Start Time: 1530 Appt. End Time: 1645  07/05/2016  [name], identified by name and date of birth, is a 41 y.o. male with a diagnosis of Diabetes: Type 2.   ASSESSMENT  Blood pressure (!) 134/94, height 5\' 11"  (1.803 m), weight 242 lb 3.2 oz (109.9 kg). Body mass index is 33.78 kg/m.      Diabetes Self-Management Education - 07/05/16 1752      Visit Information   Visit Type First/Initial     Initial Visit   Diabetes Type Type 2   Are you currently following a meal plan? Yes   What type of meal plan do you follow? "grilled food, less fried food"   Are you taking your medications as prescribed? Yes   Date Diagnosed 16 years ago     Psychosocial Assessment   Patient Belief/Attitude about Diabetes Defeat/Burnout  "it has taken a hard hit to my manhood"   Self-care barriers None   Self-management support Doctor's office;Family   Other persons present Spouse/SO   Patient Concerns Nutrition/Meal planning;Medication;Monitoring;Healthy Lifestyle;Problem Solving;Weight Control;Glycemic Control   Special Needs None   Preferred Learning Style Auditory;Hands on   Learning Readiness Ready   How often do you need to have someone help you when you read instructions, pamphlets, or other written materials from your doctor or pharmacy? 2 - Rarely  wife completed paperwork   What is the last grade level you completed in school? 10th     Pre-Education Assessment   Patient understands the diabetes disease and treatment process. Needs Instruction   Patient understands incorporating nutritional management into lifestyle. Needs Instruction   Patient undertands incorporating physical activity into lifestyle. Needs Instruction   Patient understands using medications safely. Needs Instruction   Patient understands monitoring blood glucose, interpreting and using results Needs Review   Patient understands prevention,  detection, and treatment of acute complications. Needs Instruction   Patient understands prevention, detection, and treatment of chronic complications. Needs Instruction   Patient understands how to develop strategies to address psychosocial issues. Needs Instruction   Patient understands how to develop strategies to promote health/change behavior. Needs Instruction     Complications   Last HgB A1C per patient/outside source 11.2 %  05/16/16   How often do you check your blood sugar? 0 times/day (not testing)  pt tested once in last 2 weeks. He reports reading of <120 an hour pp.    Number of hypoglycemic episodes per month --  He reports having symptoms after he skipped a meal and took his medication. He didn't check his blood sugar.    Have you had a dilated eye exam in the past 12 months? Yes   Have you had a dental exam in the past 12 months? No   Are you checking your feet? Yes   How many days per week are you checking your feet? 7     Dietary Intake   Breakfast skips   Lunch subway sub - chicken, bacon, ranch or [place] - 6 inch with no chips   Snack (afternoon) peanut butter crackers   Dinner fried meat (chicken, pork chop, fish) with green beans, occasional potatoes, beans, rice, peppers, onions   Beverage(s) water, occasional diet soda, ginger-ale     Exercise   Exercise Type ADL's     Patient Education   Previous Diabetes Education Yes (please comment)  2008 - can't remember if he came to classes or not  Disease state  Explored patient's options for treatment of their diabetes   Nutrition management  Role of diet in the treatment of diabetes and the relationship between the three main macronutrients and blood glucose level;Reviewed blood glucose goals for pre and post meals and how to evaluate the patients' food intake on their blood glucose level.;Meal timing in regards to the patients' current diabetes medication.   Physical activity and exercise  Role of exercise on  diabetes management, blood pressure control and cardiac health.   Medications Reviewed patients medication for diabetes, action, purpose, timing of dose and side effects.   Monitoring Purpose and frequency of SMBG.;Taught/discussed recording of test results and interpretation of SMBG.;Interpreting lab values - A1C, lipid, urine microalbumina.;Identified appropriate SMBG and/or A1C goals.   Acute complications Taught treatment of hypoglycemia - the 15 rule.   Chronic complications Relationship between chronic complications and blood glucose control;Nephropathy, what it is, prevention of, the use of ACE, ARB's and early detection of through urine microalbumia.   Psychosocial adjustment Identified and addressed patients feelings and concerns about diabetes     Individualized Goals (developed by patient)   Reducing Risk Improve blood sugars Decrease medications Prevent diabetes complications Lose weight Lead a healthier lifestyle Become more fit     Outcomes   Expected Outcomes Demonstrated interest in learning. Expect positive outcomes   Future DMSE 2 wks      Individualized Plan for Diabetes Self-Management Training:   Learning Objective:  Patient will have a greater understanding of diabetes self-management. Patient education plan is to attend individual and/or group sessions per assessed needs and concerns.   Plan:   Patient Instructions  Check blood sugars 2 x day before breakfast and 2 hrs after supper every day Bring blood sugar records to the next class Exercise: Walk   for    10-15  minutes  3 days a week and gradually increase to 30 minutes 5 x week Eat 3 meals day, 1-2  snacks a day Space meals 4-6 hours apart Don't skip meals Limit fried foods Avoid sugar sweetened drinks (soda)  Expected Outcomes:  Demonstrated interest in learning. Expect positive outcomes  Education material provided:  General Meal Planning Guidelines Simple Meal Plan Symptoms, causes and  treatments of Hypoglycemia  If problems or questions, patient to contact team via:  Sharion Settler, RN, CCM, CDE [phone]  Future DSME appointment: 2 wks  Jul 16, 2016 for Diabetes Class 1

## 2016-07-16 ENCOUNTER — Ambulatory Visit: Payer: BLUE CROSS/BLUE SHIELD

## 2016-07-18 ENCOUNTER — Telehealth: Payer: Self-pay | Admitting: Dietician

## 2016-07-18 NOTE — Telephone Encounter (Signed)
Called patient and left voicemail message to reschedule class 1 which he missed on 07/16/16. Offered upcoming dates and times and requested a call back.

## 2016-07-30 ENCOUNTER — Ambulatory Visit: Payer: BLUE CROSS/BLUE SHIELD

## 2016-08-06 ENCOUNTER — Ambulatory Visit: Payer: BLUE CROSS/BLUE SHIELD

## 2016-08-24 ENCOUNTER — Encounter: Payer: Self-pay | Admitting: *Deleted

## 2017-07-26 ENCOUNTER — Ambulatory Visit
Admission: RE | Admit: 2017-07-26 | Discharge: 2017-07-26 | Disposition: A | Discharge status: Home / Self Care | Payer: BLUE CROSS/BLUE SHIELD | Source: Ambulatory Visit | Attending: Medical Oncology | Admitting: Medical Oncology

## 2017-07-26 ENCOUNTER — Other Ambulatory Visit: Payer: Self-pay | Admitting: Medical Oncology

## 2017-07-26 DIAGNOSIS — J181 Lobar pneumonia, unspecified organism: Secondary | ICD-10-CM

## 2020-03-23 IMAGING — CR DG CHEST 2V
2 series · 2 of 2 positions shown · non-contrast
Comparison: None.

CLINICAL DATA: Follow-up pneumonia.

EXAM:
CHEST - 2 VIEW

[chest pa]
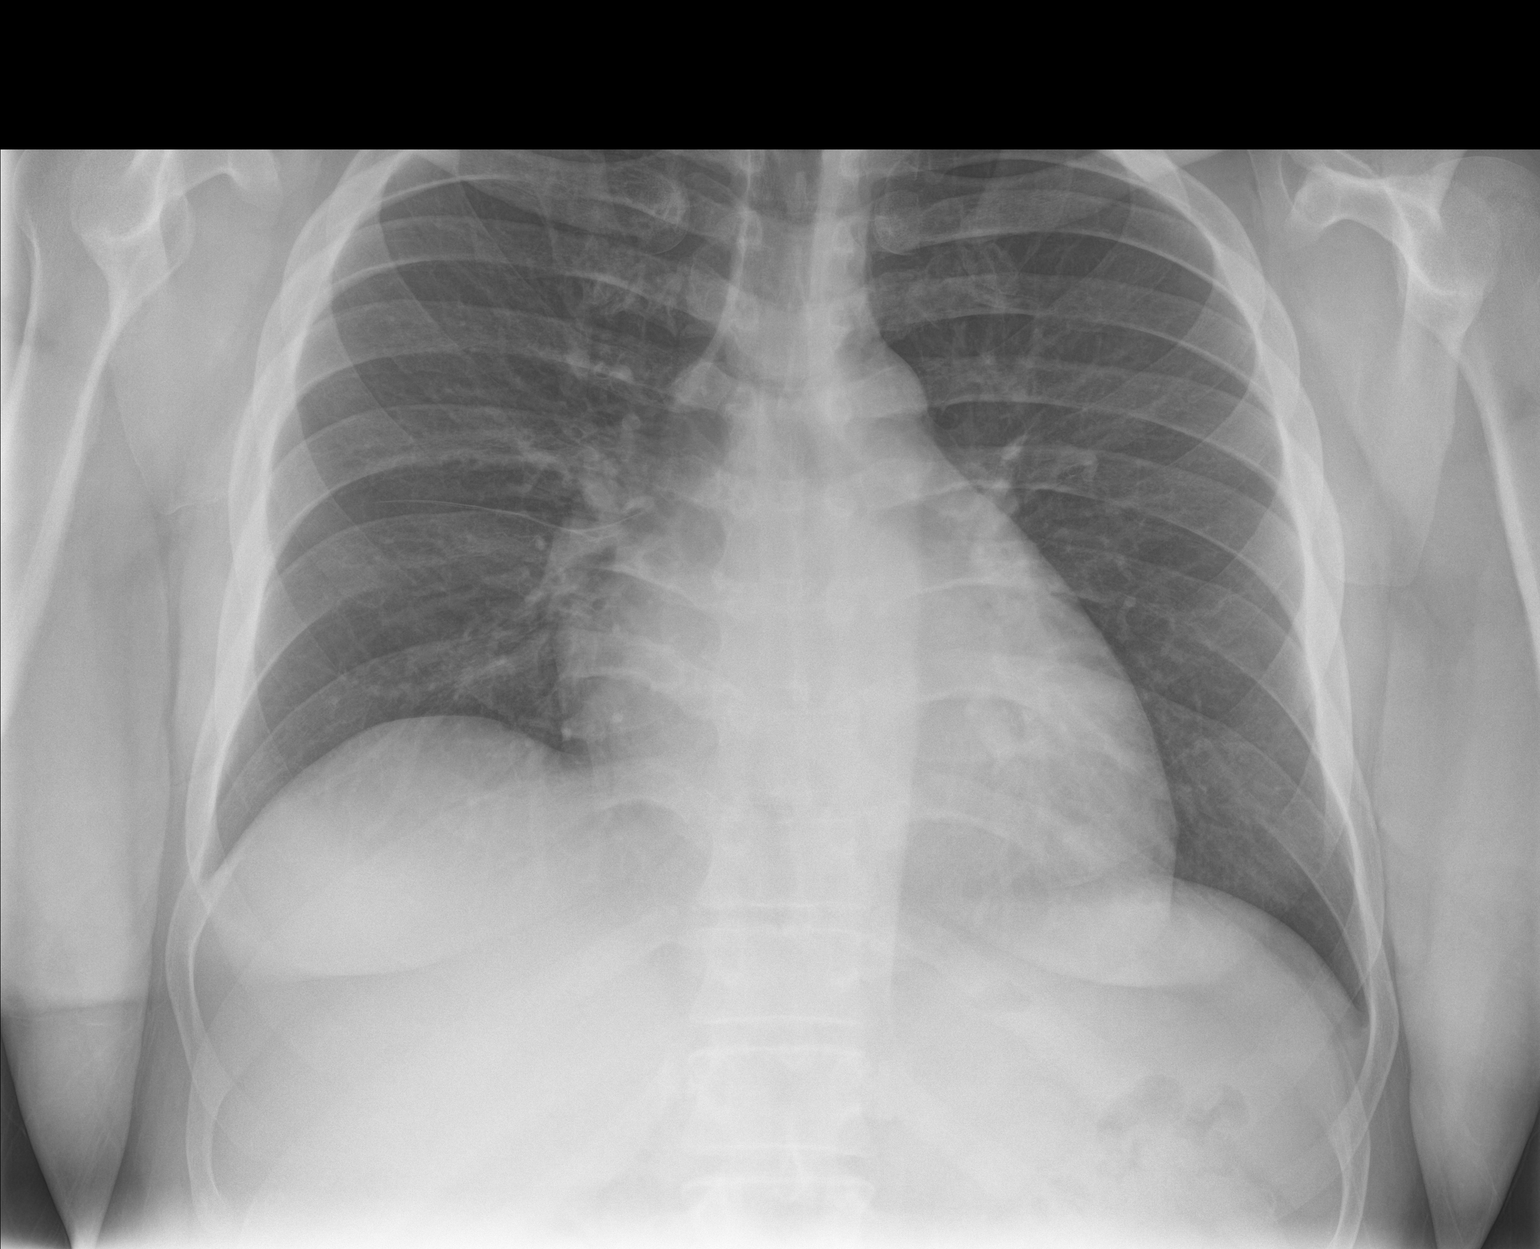

[chest lat]
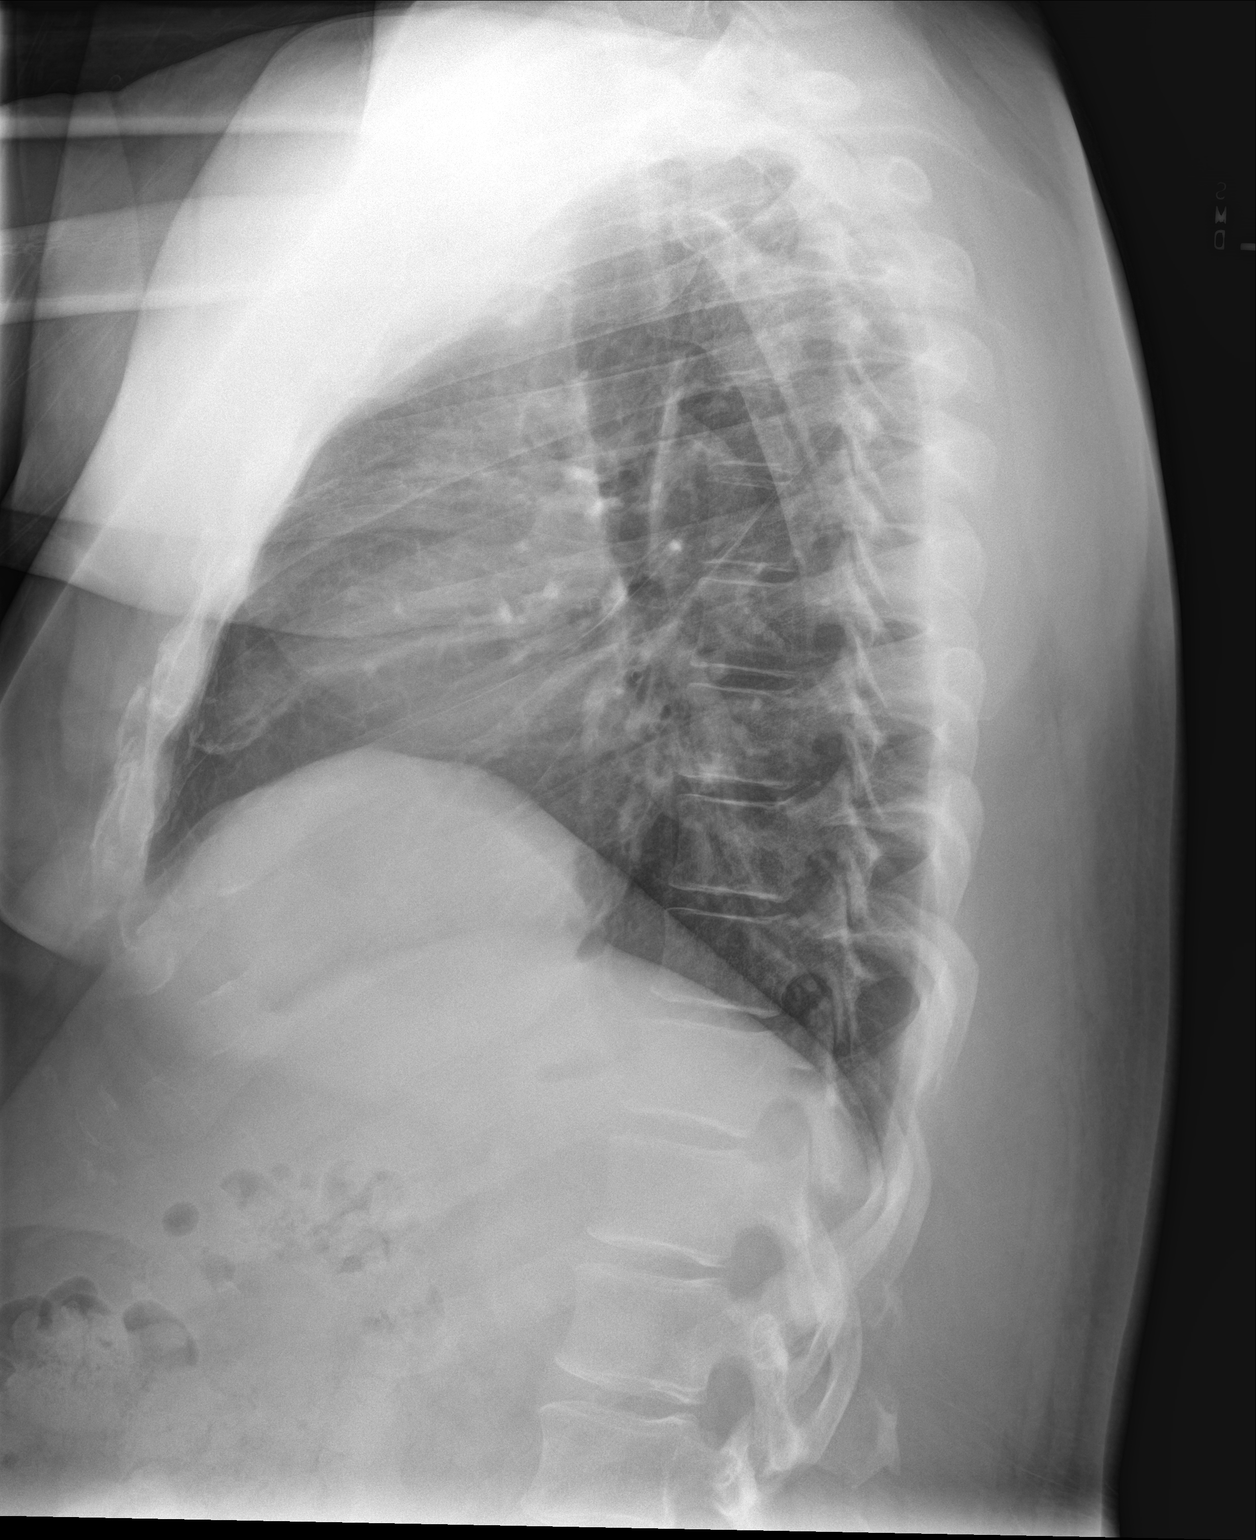

[2 of 2 positions shown; findings below may reference images not displayed]

FINDINGS: The heart size and mediastinal contours are within normal limits.
Both lungs are clear. The visualized skeletal structures are
unremarkable.
IMPRESSION: No active cardiopulmonary disease.

## 2022-06-21 ENCOUNTER — Emergency Department
Admission: EM | Admit: 2022-06-21 | Discharge: 2022-06-21 | Disposition: A | Discharge status: Home / Self Care | Payer: Medicare Other | Attending: Emergency Medicine | Admitting: Emergency Medicine

## 2022-06-21 ENCOUNTER — Encounter: Payer: Self-pay | Admitting: Emergency Medicine

## 2022-06-21 DIAGNOSIS — I120 Hypertensive chronic kidney disease with stage 5 chronic kidney disease or end stage renal disease: Secondary | ICD-10-CM | POA: Insufficient documentation

## 2022-06-21 DIAGNOSIS — E1122 Type 2 diabetes mellitus with diabetic chronic kidney disease: Secondary | ICD-10-CM | POA: Insufficient documentation

## 2022-06-21 DIAGNOSIS — R040 Epistaxis: Secondary | ICD-10-CM | POA: Insufficient documentation

## 2022-06-21 DIAGNOSIS — N186 End stage renal disease: Secondary | ICD-10-CM | POA: Insufficient documentation

## 2022-06-21 DIAGNOSIS — Z992 Dependence on renal dialysis: Secondary | ICD-10-CM | POA: Diagnosis not present

## 2022-06-21 LAB — CBC WITH DIFFERENTIAL/PLATELET
Abs Immature Granulocytes: 0.03 10*3/uL (ref 0.00–0.07)
Basophils Absolute: 0 10*3/uL (ref 0.0–0.1)
Basophils Relative: 0 %
Eosinophils Absolute: 0.3 10*3/uL (ref 0.0–0.5)
Eosinophils Relative: 4 %
HCT: 35.5 % — ABNORMAL LOW (ref 39.0–52.0)
Hemoglobin: 10.7 g/dL — ABNORMAL LOW (ref 13.0–17.0)
Immature Granulocytes: 0 %
Lymphocytes Relative: 24 %
Lymphs Abs: 1.7 10*3/uL (ref 0.7–4.0)
MCH: 27.3 pg (ref 26.0–34.0)
MCHC: 30.1 g/dL (ref 30.0–36.0)
MCV: 90.6 fL (ref 80.0–100.0)
Monocytes Absolute: 0.7 10*3/uL (ref 0.1–1.0)
Monocytes Relative: 10 %
Neutro Abs: 4.2 10*3/uL (ref 1.7–7.7)
Neutrophils Relative %: 62 %
Platelets: 137 10*3/uL — ABNORMAL LOW (ref 150–400)
RBC: 3.92 MIL/uL — ABNORMAL LOW (ref 4.22–5.81)
RDW: 22.1 % — ABNORMAL HIGH (ref 11.5–15.5)
Smear Review: NORMAL
WBC: 6.9 10*3/uL (ref 4.0–10.5)
nRBC: 0 % (ref 0.0–0.2)

## 2022-06-21 LAB — COMPREHENSIVE METABOLIC PANEL
ALT: 27 U/L (ref 0–44)
AST: 28 U/L (ref 15–41)
Albumin: 3.5 g/dL (ref 3.5–5.0)
Alkaline Phosphatase: 227 U/L — ABNORMAL HIGH (ref 38–126)
Anion gap: 15 (ref 5–15)
BUN: 66 mg/dL — ABNORMAL HIGH (ref 6–20)
CO2: 24 mmol/L (ref 22–32)
Calcium: 7.1 mg/dL — ABNORMAL LOW (ref 8.9–10.3)
Chloride: 99 mmol/L (ref 98–111)
Creatinine, Ser: 9.57 mg/dL — ABNORMAL HIGH (ref 0.61–1.24)
GFR, Estimated: 6 mL/min — ABNORMAL LOW (ref >60)
Glucose, Bld: 131 mg/dL — ABNORMAL HIGH (ref 70–99)
Potassium: 4.7 mmol/L (ref 3.5–5.1)
Sodium: 138 mmol/L (ref 135–145)
Total Bilirubin: 1.3 mg/dL — ABNORMAL HIGH (ref 0.3–1.2)
Total Protein: 7.7 g/dL (ref 6.5–8.1)

## 2022-06-21 MED ORDER — AMOXICILLIN-POT CLAVULANATE 875-125 MG PO TABS: 1.0000 | ORAL_TABLET | Freq: Two times a day (BID) | ORAL | 0 refills | Status: AC

## 2022-06-21 MED ORDER — AMLODIPINE BESYLATE 5 MG PO TABS
10.0000 mg | ORAL_TABLET | Freq: Once | ORAL | Status: AC
  Administered 2022-06-21: 10 mg via ORAL
  Filled 2022-06-21: qty 2

## 2022-06-21 MED ORDER — AMOXICILLIN-POT CLAVULANATE 875-125 MG PO TABS
1.0000 | ORAL_TABLET | Freq: Once | ORAL | Status: AC
  Administered 2022-06-21: 1 via ORAL

## 2022-06-21 MED ORDER — TRANEXAMIC ACID FOR EPISTAXIS
500.0000 mg | Freq: Once | TOPICAL | Status: AC
  Administered 2022-06-21: 500 mg via TOPICAL
  Filled 2022-06-21 (×2): qty 10

## 2022-06-21 MED ORDER — OXYMETAZOLINE HCL 0.05 % NA SOLN
1.0000 | Freq: Once | NASAL | Status: AC
  Administered 2022-06-21: 1 via NASAL
  Filled 2022-06-21: qty 30

## 2022-06-21 MED ORDER — ACETAMINOPHEN 325 MG PO TABS
650.0000 mg | ORAL_TABLET | Freq: Once | ORAL | Status: AC
  Administered 2022-06-21: 650 mg via ORAL

## 2022-06-21 MED ORDER — LABETALOL HCL 200 MG PO TABS
400.0000 mg | ORAL_TABLET | Freq: Once | ORAL | Status: AC
  Administered 2022-06-21: 400 mg via ORAL
  Filled 2022-06-21: qty 2

## 2022-06-21 NOTE — ED Notes (Signed)
Patient verbalizes understanding of discharge instructions. Opportunity for questioning and answers were provided. Armband removed by staff, pt discharged from ED. Wheeled out to and assisted into car with wife

## 2022-06-21 NOTE — ED Provider Notes (Signed)
[facility] Provider Note  Patient Contact: 10:18 PM (approximate)   History   Epistaxis   HPI  Benjamin Ramos is a 47 y.o. male with a history of hypertension, diabetes and awaiting kidney transplant currently on dialysis, presents to the emergency department with epistaxis that has occurred since this morning.  Patient reports that bleeding originally occurred from the left nare but patient is having bleeding from both nares as of now.  Patient denies experiencing similar symptoms in the past.  No chest pain, chest tightness or abdominal pain.      Physical Exam   Triage Vital Signs: ED Triage Vitals  Enc Vitals Group     BP 06/21/22 2137 (!) 150/91     Pulse Rate 06/21/22 2137 65     Resp 06/21/22 2137 18     Temp 06/21/22 2137 98.7 F (37.1 C)     Temp src --      SpO2 06/21/22 2137 96 %     Weight 06/21/22 2135 238 lb 8.6 oz (108.2 kg)     Height 06/21/22 2135  (1.803 m)     Head Circumference --      Peak Flow --      Pain Score 06/21/22 2135 0     Pain Loc --      Pain Edu? --      Excl. in GC? --     Most recent vital signs: Vitals:   06/21/22 2236 06/21/22 2245  BP: (!) 161/80 (!) 161/77  Pulse: 62 63  Resp: 18 19  Temp:    SpO2: 96% 95%     General: Alert and in no acute distress. Eyes:  PERRL. EOMI. Head: No acute traumatic findings ENT:      Nose: Patient has trickle bleeding from the bilateral nares a      Mouth/Throat: Mucous membranes are moist. Neck: No stridor. No cervical spine tenderness to palpation. Cardiovascular:  Good peripheral perfusion Respiratory: Normal respiratory effort without tachypnea or retractions. Lungs CTAB. Good air entry to the bases with no decreased or absent breath sounds. Gastrointestinal: Bowel sounds 4 quadrants. Soft and nontender to palpation. No guarding or rigidity. No palpable masses. No distention. No CVA tenderness. Musculoskeletal: Full range of motion to all extremities.   Neurologic:  No gross focal neurologic deficits are appreciated.  Skin:   No rash noted    ED Results / Procedures / Treatments   Labs (all labs ordered are listed, but only abnormal results are displayed) Labs Reviewed  CBC WITH DIFFERENTIAL/PLATELET - Abnormal; Notable for the following components:      Result Value   RBC 3.92 (*)    Hemoglobin 10.7 (*)    HCT 35.5 (*)    RDW 22.1 (*)    Platelets 137 (*)    All other components within normal limits  COMPREHENSIVE METABOLIC PANEL - Abnormal; Notable for the following components:   Glucose, Bld 131 (*)    BUN 66 (*)    Creatinine, Ser 9.57 (*)    Calcium 7.1 (*)    Alkaline Phosphatase 227 (*)    Total Bilirubin 1.3 (*)    GFR, Estimated 6 (*)    All other components within normal limits        PROCEDURES:  Critical Care performed: No  Procedures   MEDICATIONS ORDERED IN ED: Medications  oxymetazoline (AFRIN) 0.05 % nasal spray 1 spray (1 spray Each Nare Given 06/21/22 2233)  labetalol (NORMODYNE) tablet  400 mg (400 mg Oral Given 06/21/22 2335)  amLODipine (NORVASC) tablet 10 mg (10 mg Oral Given 06/21/22 2231)  tranexamic acid (CYKLOKAPRON) 1000 MG/10ML topical solution 500 mg (500 mg Topical Given by Other 06/21/22 2337)  amoxicillin-clavulanate (AUGMENTIN) 875-125 MG per tablet 1 tablet (1 tablet Oral Given 06/21/22 2354)  acetaminophen (TYLENOL) tablet 650 mg (650 mg Oral Given 06/21/22 2354)     IMPRESSION / MDM / ASSESSMENT AND PLAN / ED COURSE  I reviewed the triage vital signs and the nursing notes.                              Assessment and plan: Epistaxis:  47 year old male with history of hypertension, diabetes, end-stage renal disease currently on kidney transplant list, presents to the emergency department with epistaxis of the bilateral nares that started this morning.  Patient was hypertensive at triage but vital signs otherwise reassuring.  On exam, patient had a slow trickle of blood from the  bilateral nares.  He was resting comfortably and tolerating his own secretions.  Basic labs were obtained which showed reassuring H&H.  Patient has not missed any dialysis appointments and aside from epistaxis, his overall felt well.  CMP is consistent with his baseline labs.  Patient still had persistent trickle of bleeding from the bilateral nares despite nasal clipping and Afrin use.  I did reach out to otolaryngologist on-call, Dr. Andee Poles and discussed patient's case.  Dr. Andee Poles recommended unilateral and possibly bilateral packing with Merocel.  He suggested packing 1 side and observing.  Patient's left nare was packed with Merisel and patient only had a very scant amount of bleeding from the right nare and was observed for approximately 30 to 45 minutes after Murocel placement.  I discussed packing the left side and admitting patient overnight for observation but patient declined at this time, stating that he would like to hold off on packing the right nare.  I do think that this is appropriate at this time given very scant amount of active bleeding.  Patient and wife do not live very far from the emergency department and they state that they can return to the emergency department if bleeding of the left nare were to increase tonight.  Dr. Andee Poles did recommend being discharged with antibiotic.  Patient was started on his first dose of Augmentin while in the emergency department and was given Tylenol for discomfort.  He was also given his nighttime blood pressure medicines, labetalol and amlodipine.  I emphasized the importance of relaxing at home with keeping the head of the bed elevated and compliance to blood pressure medicine.  Patient voiced understanding.  He was advised to make an appointment with Dr. Andee Poles for Merocel removal.  All patient questions were answered prior to discharge.     FINAL CLINICAL IMPRESSION(S) / ED DIAGNOSES   Final diagnoses:  Epistaxis     Rx / DC Orders    ED Discharge Orders          Ordered    amoxicillin-clavulanate (AUGMENTIN) 875-125 MG tablet  2 times daily        06/21/22 2343             Note:  This document was prepared using Dragon voice recognition software and may include unintentional dictation errors.   Pia [address], PA-C 06/21/22 Phineas Douglas    Corena Herter, MD 06/26/22 [phone]

## 2022-06-21 NOTE — Discharge Instructions (Addendum)
Please make follow up appointment with Dr. Andee Poles for Murocel removal.  Start taking Augmentin twice daily.  Return if symptoms worsen at home.

## 2022-06-21 NOTE — ED Triage Notes (Signed)
Pt presents ambulatory to triage via POV with complaints of nose bleed that started this AM. Not on thinners, denies any injury or scratch. He notes not seeing any clotting but has some dripping of blood from his R nare. He notes during the day the bleeding has gone from L to R nare. A&Ox4 at this time. Denies dizziness, CP or SOB.

## 2022-06-22 ENCOUNTER — Other Ambulatory Visit: Payer: Self-pay

## 2022-06-22 ENCOUNTER — Emergency Department
Admission: EM | Admit: 2022-06-22 | Discharge: 2022-06-22 | Disposition: A | Discharge status: Home / Self Care | Payer: Medicare Other | Attending: Student in an Organized Health Care Education/Training Program | Admitting: Student in an Organized Health Care Education/Training Program

## 2022-06-22 DIAGNOSIS — R040 Epistaxis: Secondary | ICD-10-CM | POA: Diagnosis not present

## 2022-06-22 DIAGNOSIS — N186 End stage renal disease: Secondary | ICD-10-CM | POA: Insufficient documentation

## 2022-06-22 DIAGNOSIS — J3489 Other specified disorders of nose and nasal sinuses: Secondary | ICD-10-CM | POA: Insufficient documentation

## 2022-06-22 DIAGNOSIS — Z992 Dependence on renal dialysis: Secondary | ICD-10-CM | POA: Insufficient documentation

## 2022-06-22 LAB — BASIC METABOLIC PANEL
Anion gap: 14 (ref 5–15)
BUN: 77 mg/dL — ABNORMAL HIGH (ref 6–20)
CO2: 25 mmol/L (ref 22–32)
Calcium: 7.4 mg/dL — ABNORMAL LOW (ref 8.9–10.3)
Chloride: 98 mmol/L (ref 98–111)
Creatinine, Ser: 10.39 mg/dL — ABNORMAL HIGH (ref 0.61–1.24)
GFR, Estimated: 6 mL/min — ABNORMAL LOW (ref >60)
Glucose, Bld: 131 mg/dL — ABNORMAL HIGH (ref 70–99)
Potassium: 5.3 mmol/L — ABNORMAL HIGH (ref 3.5–5.1)
Sodium: 137 mmol/L (ref 135–145)

## 2022-06-22 LAB — CBC WITH DIFFERENTIAL/PLATELET
Abs Immature Granulocytes: 0.02 10*3/uL (ref 0.00–0.07)
Basophils Absolute: 0 10*3/uL (ref 0.0–0.1)
Basophils Relative: 1 %
Eosinophils Absolute: 0.2 10*3/uL (ref 0.0–0.5)
Eosinophils Relative: 4 %
HCT: 35.5 % — ABNORMAL LOW (ref 39.0–52.0)
Hemoglobin: 10.8 g/dL — ABNORMAL LOW (ref 13.0–17.0)
Immature Granulocytes: 0 %
Lymphocytes Relative: 22 %
Lymphs Abs: 1.4 10*3/uL (ref 0.7–4.0)
MCH: 27.6 pg (ref 26.0–34.0)
MCHC: 30.4 g/dL (ref 30.0–36.0)
MCV: 90.8 fL (ref 80.0–100.0)
Monocytes Absolute: 0.7 10*3/uL (ref 0.1–1.0)
Monocytes Relative: 10 %
Neutro Abs: 4.2 10*3/uL (ref 1.7–7.7)
Neutrophils Relative %: 63 %
Platelets: 127 10*3/uL — ABNORMAL LOW (ref 150–400)
RBC: 3.91 MIL/uL — ABNORMAL LOW (ref 4.22–5.81)
RDW: 21.8 % — ABNORMAL HIGH (ref 11.5–15.5)
WBC: 6.6 10*3/uL (ref 4.0–10.5)
nRBC: 0 % (ref 0.0–0.2)

## 2022-06-22 MED ORDER — LIDOCAINE HCL (PF) 1 % IJ SOLN
INTRAMUSCULAR | Status: AC
  Filled 2022-06-22: qty 5

## 2022-06-22 MED ORDER — LIDOCAINE-EPINEPHRINE (PF) 2 %-1:200000 IJ SOLN
INTRAMUSCULAR | Status: AC
  Filled 2022-06-22: qty 20

## 2022-06-22 MED ORDER — LIDOCAINE-EPINEPHRINE 2 %-1:100000 IJ SOLN
20.0000 mL | Freq: Once | INTRAMUSCULAR | Status: AC
  Administered 2022-06-22: 20 mL

## 2022-06-22 MED ORDER — OXYMETAZOLINE HCL 0.05 % NA SOLN
1.0000 | Freq: Once | NASAL | Status: AC
  Administered 2022-06-22: 1 via NASAL
  Filled 2022-06-22: qty 30

## 2022-06-22 NOTE — ED Triage Notes (Signed)
Pt comes with c/o nose bleed. Pt was seen here in ED last night and had left side packed. Pt states it continues to bleed. Pt is not on thinners.   Attempted to place nose plug but too small. No bleeding noted at this time.

## 2022-06-22 NOTE — ED Provider Notes (Signed)
[facility] Provider Note    None    (approximate)   History   Epistaxis   HPI  Benjamin Ramos is a 47 y.o. male history of ESRD on dialysis previous transplant presents to the ER for second day of nasal bleeding.  Was seen in the ER yesterday and had left anterior nare packed.  Had persistent bleeding today.  Has taken antibiotics as prescribed yesterday.  No heavy bleeding states is just drip that is now coming out of the right side.     Physical Exam   Triage Vital Signs: ED Triage Vitals [06/22/22 1503]  Enc Vitals Group     BP (!) 149/76     Pulse Rate 60     Resp 18     Temp 98 F (36.7 C)     Temp src      SpO2 99 %     Weight      Height      Head Circumference      Peak Flow      Pain Score 0     Pain Loc      Pain Edu?      Excl. in GC?     Most recent vital signs: Vitals:   06/22/22 1503 06/22/22 1800  BP: (!) 149/76   Pulse: 60 (!) 57  Resp: 18   Temp: 98 F (36.7 C)   SpO2: 99% 96%     Constitutional: Alert  Eyes: Conjunctivae are normal.  Head: Atraumatic. Nose: Septal perforation with area of hemorrhage coming from area of irritated eroded septal tissue.  Slow bleed noted.  No posterior hemorrhage. Mouth/Throat: Mucous membranes are moist.   Neck: Painless ROM.  Cardiovascular:   Good peripheral circulation. Respiratory: Normal respiratory effort.  No retractions.  Gastrointestinal: Soft and nontender.  Musculoskeletal:  no deformity Neurologic:  MAE spontaneously. No gross focal neurologic deficits are appreciated.  Skin:  Skin is warm, dry and intact. No rash noted. Psychiatric: Mood and affect are normal. Speech and behavior are normal.    ED Results / Procedures / Treatments   Labs (all labs ordered are listed, but only abnormal results are displayed) Labs Reviewed  CBC WITH DIFFERENTIAL/PLATELET - Abnormal; Notable for the following components:      Result Value   RBC 3.91 (*)    Hemoglobin 10.8  (*)    HCT 35.5 (*)    RDW 21.8 (*)    Platelets 127 (*)    All other components within normal limits  BASIC METABOLIC PANEL - Abnormal; Notable for the following components:   Potassium 5.3 (*)    Glucose, Bld 131 (*)    BUN 77 (*)    Creatinine, Ser 10.39 (*)    Calcium 7.4 (*)    GFR, Estimated 6 (*)    All other components within normal limits     EKG     RADIOLOGY    PROCEDURES:  Critical Care performed:   .Epistaxis Management  Date/Time: 06/22/2022 6:26 PM  Performed by: Willy Eddy, MD Authorized by: Willy Eddy, MD   Anesthesia:    Anesthesia method:  Topical application   Topical anesthetic:  Lidocaine gel Procedure details:    Treatment site:  L anterior, R anterior and R septum   Treatment method:  Merocel sponge   Treatment episode: recurring   Post-procedure details:    Assessment:  Bleeding stopped   Procedure completion:  Tolerated    MEDICATIONS ORDERED IN  ED: Medications  oxymetazoline (AFRIN) 0.05 % nasal spray 1 spray (1 spray Each Nare Given by Other 06/22/22 1728)  lidocaine-EPINEPHrine (XYLOCAINE W/EPI) 2 %-1:100000 (with pres) injection 20 mL (20 mLs Other Given by Other 06/22/22 1729)  lidocaine-EPINEPHrine (XYLOCAINE W/EPI) 2 %-1:200000 (PF) injection (  Given 06/22/22 1729)  lidocaine (PF) (XYLOCAINE) 1 % injection (  Given 06/22/22 1822)     IMPRESSION / MDM / ASSESSMENT AND PLAN / ED COURSE  I reviewed the triage vital signs and the nursing notes.                              Differential diagnosis includes, but is not limited to, posterior epistaxis, mass, septal perforation, anterior bleed  Patient presented to the ER for evaluation of recurrent epistaxis in the setting of septal perforation.  Patient denies any cocaine or illicit drug use.  Based on appearance and as the patient denies any trauma I suspect this is a chronic finding.  Given the slow bleeding will require bilateral packing in the setting of septal  perforation to achieve hemostasis.  He tolerated bilateral anterior pack without complication.  Patient was agreeable to plan.  Do not feel that he requires any further observation or management here in the ER does appear stable and appropriate for outpatient follow-up.       FINAL CLINICAL IMPRESSION(S) / ED DIAGNOSES   Final diagnoses:  Epistaxis  Nasal septal perforation     Rx / DC Orders   ED Discharge Orders     None        Note:  This document was prepared using Dragon voice recognition software and may include unintentional dictation errors.    Willy Eddy, MD 06/22/22 [phone]

## 2022-06-23 ENCOUNTER — Other Ambulatory Visit: Payer: Self-pay

## 2022-06-23 DIAGNOSIS — Z5321 Procedure and treatment not carried out due to patient leaving prior to being seen by health care provider: Secondary | ICD-10-CM | POA: Diagnosis not present

## 2022-06-23 DIAGNOSIS — R040 Epistaxis: Secondary | ICD-10-CM | POA: Diagnosis present

## 2022-06-23 NOTE — ED Triage Notes (Signed)
Pt to ED via POV c/o nosebleed. Pt says he's been here twice. Was told to remove packing in nose after a week. Pt states that packing has been causing his head to hurt and wants it out.  Pt does not take blood thinners.

## 2022-06-24 ENCOUNTER — Emergency Department
Admission: EM | Admit: 2022-06-24 | Discharge: 2022-06-24 | Discharge status: Against Medical Advice | Payer: BLUE CROSS/BLUE SHIELD | Attending: Emergency Medicine | Admitting: Emergency Medicine

## 2023-06-19 ENCOUNTER — Other Ambulatory Visit
Admission: RE | Admit: 2023-06-19 | Discharge: 2023-06-19 | Disposition: A | Discharge status: Home / Self Care | Source: Ambulatory Visit | Attending: Nephrology | Admitting: Nephrology

## 2023-06-19 DIAGNOSIS — N186 End stage renal disease: Secondary | ICD-10-CM | POA: Insufficient documentation

## 2023-06-19 LAB — POTASSIUM: Potassium: 5.6 mmol/L — ABNORMAL HIGH (ref 3.5–5.1)

## 2023-06-20 LAB — HEPATITIS B SURFACE ANTIGEN: Hepatitis B Surface Ag: NONREACTIVE
